# Patient Record
Sex: Male | Born: 1960 | Race: White | Hispanic: No | Marital: Married | Smoking: Current every day smoker
Health system: Southern US, Community
[De-identification: ages and names within clinical notes are randomized; demographics above are authoritative.]

## PROBLEM LIST (undated history)

## (undated) DIAGNOSIS — M1990 Unspecified osteoarthritis, unspecified site: Secondary | ICD-10-CM

## (undated) DIAGNOSIS — G629 Polyneuropathy, unspecified: Secondary | ICD-10-CM

## (undated) DIAGNOSIS — I10 Essential (primary) hypertension: Secondary | ICD-10-CM

## (undated) DIAGNOSIS — J449 Chronic obstructive pulmonary disease, unspecified: Secondary | ICD-10-CM

## (undated) HISTORY — PX: FRACTURE SURGERY: SHX138

---

## 2006-02-02 ENCOUNTER — Emergency Department: Payer: Self-pay | Admitting: Emergency Medicine

## 2012-01-01 ENCOUNTER — Ambulatory Visit: Payer: Self-pay | Admitting: Family Medicine

## 2016-07-12 ENCOUNTER — Encounter: Payer: Self-pay | Admitting: Emergency Medicine

## 2016-07-12 ENCOUNTER — Emergency Department
Admission: EM | Admit: 2016-07-12 | Discharge: 2016-07-12 | Disposition: A | Discharge status: Home / Self Care | Payer: Self-pay | Attending: Emergency Medicine | Admitting: Emergency Medicine

## 2016-07-12 DIAGNOSIS — F17210 Nicotine dependence, cigarettes, uncomplicated: Secondary | ICD-10-CM | POA: Insufficient documentation

## 2016-07-12 DIAGNOSIS — I10 Essential (primary) hypertension: Secondary | ICD-10-CM | POA: Insufficient documentation

## 2016-07-12 DIAGNOSIS — J449 Chronic obstructive pulmonary disease, unspecified: Secondary | ICD-10-CM | POA: Insufficient documentation

## 2016-07-12 DIAGNOSIS — F1123 Opioid dependence with withdrawal: Secondary | ICD-10-CM | POA: Insufficient documentation

## 2016-07-12 DIAGNOSIS — F1193 Opioid use, unspecified with withdrawal: Secondary | ICD-10-CM

## 2016-07-12 HISTORY — DX: Essential (primary) hypertension: I10

## 2016-07-12 HISTORY — DX: Unspecified osteoarthritis, unspecified site: M19.90

## 2016-07-12 HISTORY — DX: Chronic obstructive pulmonary disease, unspecified: J44.9

## 2016-07-12 HISTORY — DX: Polyneuropathy, unspecified: G62.9

## 2016-07-12 MED ORDER — BACLOFEN 10 MG PO TABS: 10.0000 mg | ORAL_TABLET | Freq: Three times a day (TID) | ORAL | 0 refills | Status: AC | PRN

## 2016-07-12 MED ORDER — KETOROLAC TROMETHAMINE 60 MG/2ML IM SOLN
INTRAMUSCULAR | Status: AC
  Administered 2016-07-12: 60 mg via INTRAMUSCULAR
  Filled 2016-07-12: qty 2

## 2016-07-12 MED ORDER — ONDANSETRON 4 MG PO TBDP
ORAL_TABLET | ORAL | Status: AC
  Administered 2016-07-12: 4 mg via ORAL
  Filled 2016-07-12: qty 1

## 2016-07-12 MED ORDER — BACLOFEN 5 MG HALF TABLET
5.0000 mg | ORAL_TABLET | Freq: Once | ORAL | Status: AC
  Administered 2016-07-12: 5 mg via ORAL
  Filled 2016-07-12: qty 1

## 2016-07-12 MED ORDER — ONDANSETRON HCL 4 MG PO TABS: 4.0000 mg | ORAL_TABLET | Freq: Three times a day (TID) | ORAL | 0 refills | Status: AC | PRN

## 2016-07-12 MED ORDER — KETOROLAC TROMETHAMINE 60 MG/2ML IM SOLN
60.0000 mg | Freq: Once | INTRAMUSCULAR | Status: AC
  Administered 2016-07-12: 60 mg via INTRAMUSCULAR

## 2016-07-12 MED ORDER — ONDANSETRON 4 MG PO TBDP
4.0000 mg | ORAL_TABLET | Freq: Once | ORAL | Status: AC
  Administered 2016-07-12: 4 mg via ORAL
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
## 2016-07-12 NOTE — Discharge Instructions (Signed)
Please seek medical attention for any high fevers, chest pain, shortness of breath, change in behavior, persistent vomiting, bloody stool or any other new or concerning symptoms.  
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
## 2016-08-31 ENCOUNTER — Emergency Department
Admission: EM | Admit: 2016-08-31 | Discharge: 2016-09-01 | Disposition: A | Discharge status: Home / Self Care | Payer: Self-pay | Attending: Emergency Medicine | Admitting: Emergency Medicine

## 2016-08-31 ENCOUNTER — Emergency Department: Payer: Self-pay

## 2016-08-31 DIAGNOSIS — M25474 Effusion, right foot: Secondary | ICD-10-CM

## 2016-08-31 DIAGNOSIS — J449 Chronic obstructive pulmonary disease, unspecified: Secondary | ICD-10-CM | POA: Insufficient documentation

## 2016-08-31 DIAGNOSIS — F17210 Nicotine dependence, cigarettes, uncomplicated: Secondary | ICD-10-CM | POA: Insufficient documentation

## 2016-08-31 DIAGNOSIS — I10 Essential (primary) hypertension: Secondary | ICD-10-CM | POA: Insufficient documentation

## 2016-08-31 DIAGNOSIS — M722 Plantar fascial fibromatosis: Secondary | ICD-10-CM | POA: Insufficient documentation

## 2016-09-01 MED ORDER — PREDNISONE 20 MG PO TABS
60.0000 mg | ORAL_TABLET | Freq: Once | ORAL | Status: AC
  Administered 2016-09-01: 60 mg via ORAL
  Filled 2016-09-01: qty 3

## 2016-09-01 MED ORDER — PREDNISONE 20 MG PO TABS: 60.0000 mg | ORAL_TABLET | Freq: Every day | ORAL | 0 refills | Status: AC

## 2016-09-01 MED ORDER — METOPROLOL TARTRATE 50 MG PO TABS
ORAL_TABLET | ORAL | Status: AC
  Filled 2016-09-01: qty 2

## 2016-09-01 NOTE — Discharge Instructions (Signed)
Please take your prednisone 60 mg daily for the next 4 days. Please follow-up with podiatry and please use your crutches until you are able to follow-up.

## 2016-09-01 NOTE — ED Provider Notes (Signed)
[facility] Emergency Department Provider Note   ____________________________________________   First MD Initiated Contact with Patient 09/01/16 [phone]     (approximate)  I have reviewed the triage vital signs and the nursing notes.   HISTORY  Chief Complaint Foot Pain    HPI Jesus Jenkins is a 56 y.o. male who comes into the hospital today with some right foot swelling. The patient reports that it started on Sunday. He woke up at about 5:30 Sunday morning to use the restroom and it was fine but when he got up at 7 and he put his foot on the floor he felt pain. He reports that he got up this morning and it was fine because the swelling seemed to go down after he slept and had elevated but then as the day went on the swelling returned. The patient also states that he had continued pain whenever he tried to put his foot down. The patient states that this pain is from his heel to his big toe on the plantar surface of his foot. He reports that he's been on Suboxone and had previously been with pain management. The foot does not hurt at baseline but he reports that whenever he tries to place weight on his foot hurts. The patient reports that he looked on the Internet trying to find the cause and his concern was for possible clot versus gout. The patient denies a history of gout and he also denies a family history of gout. The patient also thought he might of been bitten by something.The patient is here today for evaluation of his foot swelling. The patient denies any trauma or anything else that could've caused the swelling and pain.   Past Medical History:  Diagnosis Date  . Arthritis   . COPD (chronic obstructive pulmonary disease) (HCC)   . Hypertension   . Neuropathy (HCC)     There are no active problems to display for this patient.   Past Surgical History:  Procedure Laterality Date  . FRACTURE SURGERY      Prior to Admission medications     Medication Sig Start Date End Date Taking? Authorizing Provider  baclofen (LIORESAL) 10 MG tablet Take 1 tablet (10 mg total) by mouth 3 (three) times daily as needed (Pain). 07/12/16   Phineas SemenGoodman, Graydon, MD  ondansetron (ZOFRAN) 4 MG tablet Take 1 tablet (4 mg total) by mouth every 8 (eight) hours as needed for nausea or vomiting. 07/12/16   Phineas SemenGoodman, Graydon, MD    Allergies Neurontin [gabapentin]; Tramadol; and Flexeril [cyclobenzaprine]  No family history on file.  Social History Social History  Substance Use Topics  . Smoking status: Current Every Day Smoker    Packs/day: 1.00    Types: Cigarettes  . Smokeless tobacco: Never Used  . Alcohol use No    Review of Systems  Constitutional: No fever/chills Eyes: No visual changes. ENT: No sore throat. Cardiovascular: Denies chest pain. Respiratory: Denies shortness of breath. Gastrointestinal: No abdominal pain.  No nausea, no vomiting.  No diarrhea.  No constipation. Genitourinary: Negative for dysuria. Musculoskeletal: Right foot swelling Skin: Negative for rash. Neurological: Negative for headaches, focal weakness or numbness.   ____________________________________________   PHYSICAL EXAM:  VITAL SIGNS: ED Triage Vitals  Enc Vitals Group     BP 08/31/16 2059 (!) 158/90     Pulse Rate 08/31/16 2059 78     Resp 08/31/16 2059 18     Temp 08/31/16 2059 98.1 F (36.7 C)  Temp Source 08/31/16 2059 Oral     SpO2 08/31/16 2059 99 %     Weight 08/31/16 2059 159 lb (72.1 kg)     Height 08/31/16 2059 5\' 10"  (1.778 m)     Head Circumference --      Peak Flow --      Pain Score 08/31/16 2058 0     Pain Loc --      Pain Edu? --      Excl. in GC? --     Constitutional: Alert and oriented. Well appearing and in Mild distress. Eyes: Conjunctivae are normal. PERRL. EOMI. Head: Atraumatic. Nose: No congestion/rhinnorhea. Mouth/Throat: Mucous membranes are moist.  Oropharynx non-erythematous. Cardiovascular: Normal  rate, regular rhythm. Grossly normal heart sounds.  Good peripheral circulation. Respiratory: Normal respiratory effort.  No retractions. Lungs CTAB. Gastrointestinal: Soft and nontender. No distention. Positive bowel sounds Musculoskeletal: Soft tissue swelling to the right foot with some tenderness to palpation over the plantar fascia from the heel to the big toe. Neurologic:  Normal speech and language.  Skin:  Skin is warm, dry and intact.  Psychiatric: Mood and affect are normal.   ____________________________________________   LABS (all labs ordered are listed, but only abnormal results are displayed)  Labs Reviewed - No data to display ____________________________________________  EKG  none ____________________________________________  RADIOLOGY  US venous RLE ____________________________________________   PROCEDURES  Procedure(s) performed: None  Procedures  Critical Care performed: No  ____________________________________________   INITIAL IMPRESSION / ASSESSMENT AND PLAN / ED COURSE  Pertinent labs & imaging results that were available during my care of the patient were reviewed by me and considered in my medical decision making (see chart for details).  This is a 56 year old male who comes into the hospital today with some right foot swelling and some pain over the plantar fascia. The patient's foot is not red or warm to touch. He has pain along the plantar fascia and not in the joint of the big toe or the ankle. I do not feel that this is gout causing his symptoms. The patient did receive an ultrasound of his leg looking for DVT and it was negative. I feel that the patient has some plantar fasciitis. The patient reports that he does have crutches at home as I suggested the patient be nonweightbearing and try some steroids and anti-inflammatories to help with the pain. The patient should follow-up with podiatry for further evaluation of this foot pain and  swelling. I will recommend the patient follow up with podiatry. The patient will receive a dose of prednisone here in the emergency department.  Clinical Course as of Sep 02 111  Tue Sep 01, 2016  0026 No evidence of DVT within the right lower extremity. US Venous Img Lower Unilateral Right [AW]    Clinical Course User Index [AW] Rebecka Apley, MD     ____________________________________________   FINAL CLINICAL IMPRESSION(S) / ED DIAGNOSES  Final diagnoses:  Plantar fasciitis      NEW MEDICATIONS STARTED DURING THIS VISIT:  New Prescriptions   No medications on file     Note:  This document was prepared using Dragon voice recognition software and may include unintentional dictation errors.    Rebecka Apley, MD 09/01/16 [phone]

## 2016-12-09 ENCOUNTER — Ambulatory Visit: Payer: Self-pay | Admitting: Internal Medicine

## 2016-12-09 ENCOUNTER — Encounter: Payer: Self-pay | Admitting: Internal Medicine

## 2016-12-09 VITALS — BP 136/79 | HR 77 | Temp 97.9°F | Wt 154.2 lb

## 2016-12-09 DIAGNOSIS — F1920 Other psychoactive substance dependence, uncomplicated: Secondary | ICD-10-CM

## 2016-12-09 DIAGNOSIS — Z122 Encounter for screening for malignant neoplasm of respiratory organs: Secondary | ICD-10-CM

## 2016-12-09 NOTE — Progress Notes (Signed)
   Subjective:    Patient ID: Jesus Jenkins, male    DOB: 1960/11/08, 56 y.o.   MRN: [id number]  HPI   Pt reports seeing pain management specialist but reports that he was unhappy with his services; he reports stopping all opioid use in the last 5 mo. after 8 years of use.    Pt reports seeking care elsewhere for behavioral health due to feelings of anxiety and depression for the last 5 years.   Pt reports using Suboxone.   Pt reports being diagnosed with emphysema due to smoking for 43 years.   Pt reports waking 2-3 times in the night for urination.   Pt reports both biological parents have diabetes; mother also has history of lung and stomach cancer.      There are no active problems to display for this patient.   Allergies as of 12/09/2016      Reactions   Neurontin [gabapentin] Other (See Comments)   Pt does not know   Tramadol Other (See Comments)   Flexeril [cyclobenzaprine] Rash      Medication List       Accurate as of 12/09/16 10:45 AM. Always use your most recent med list.          baclofen 10 MG tablet Commonly known as:  LIORESAL Take 1 tablet (10 mg total) by mouth 3 (three) times daily as needed (Pain).   ondansetron 4 MG tablet Commonly known as:  ZOFRAN Take 1 tablet (4 mg total) by mouth every 8 (eight) hours as needed for nausea or vomiting.   predniSONE 20 MG tablet Commonly known as:  DELTASONE Take 3 tablets (60 mg total) by mouth daily.        Review of Systems     Objective:   Physical Exam  Constitutional: He is oriented to person, place, and time.  Cardiovascular: Normal rate, regular rhythm and normal heart sounds.   Pulmonary/Chest: Effort normal and breath sounds normal.  Neurological: He is alert and oriented to person, place, and time.     BP 136/79 (BP Location: Left Arm)   Pulse 77   Temp 97.9 F (36.6 C)   Wt 154 lb 3.2 oz (69.9 kg)   BMI 22.13 kg/m          Assessment & Plan:   Labs today: CBC, Met C,  TSH, UA, Lipid, PSA  Order CAT scan for lungs to look for nodules due to prolonged smoking (1pack/day 43 years).

## 2016-12-10 LAB — URINALYSIS
Bilirubin, UA: NEGATIVE
Glucose, UA: NEGATIVE
Leukocytes, UA: NEGATIVE
Nitrite, UA: NEGATIVE
RBC, UA: NEGATIVE
Specific Gravity, UA: 1.026 (ref 1.005–1.030)
Urobilinogen, Ur: 1 mg/dL (ref 0.2–1.0)
pH, UA: 5 (ref 5.0–7.5)

## 2016-12-10 LAB — COMPREHENSIVE METABOLIC PANEL WITH GFR
ALT: 35 IU/L (ref 0–44)
AST: 31 IU/L (ref 0–40)
Albumin/Globulin Ratio: 1.5 (ref 1.2–2.2)
Albumin: 4.5 g/dL (ref 3.5–5.5)
Alkaline Phosphatase: 86 IU/L (ref 39–117)
BUN/Creatinine Ratio: 13 (ref 9–20)
BUN: 12 mg/dL (ref 6–24)
Bilirubin Total: 0.5 mg/dL (ref 0.0–1.2)
CO2: 24 mmol/L (ref 20–29)
Calcium: 9.4 mg/dL (ref 8.7–10.2)
Chloride: 103 mmol/L (ref 96–106)
Creatinine, Ser: 0.93 mg/dL (ref 0.76–1.27)
GFR calc Af Amer: 105 mL/min/1.73
GFR calc non Af Amer: 91 mL/min/1.73
Globulin, Total: 3.1 g/dL (ref 1.5–4.5)
Glucose: 92 mg/dL (ref 65–99)
Potassium: 4.7 mmol/L (ref 3.5–5.2)
Sodium: 142 mmol/L (ref 134–144)
Total Protein: 7.6 g/dL (ref 6.0–8.5)

## 2016-12-10 LAB — LIPID PANEL
Chol/HDL Ratio: 4.2 ratio (ref 0.0–5.0)
Cholesterol, Total: 173 mg/dL (ref 100–199)
HDL: 41 mg/dL
LDL Calculated: 118 mg/dL — ABNORMAL HIGH (ref 0–99)
Triglycerides: 68 mg/dL (ref 0–149)
VLDL Cholesterol Cal: 14 mg/dL (ref 5–40)

## 2016-12-10 LAB — CBC
Hematocrit: 46.3 % (ref 37.5–51.0)
Hemoglobin: 15.6 g/dL (ref 13.0–17.7)
MCH: 29.5 pg (ref 26.6–33.0)
MCHC: 33.7 g/dL (ref 31.5–35.7)
MCV: 88 fL (ref 79–97)
Platelets: 228 x10E3/uL (ref 150–379)
RBC: 5.28 x10E6/uL (ref 4.14–5.80)
RDW: 13.6 % (ref 12.3–15.4)
WBC: 7 x10E3/uL (ref 3.4–10.8)

## 2016-12-10 LAB — PSA: Prostate Specific Ag, Serum: 1.5 ng/mL (ref 0.0–4.0)

## 2016-12-10 LAB — TSH: TSH: 1.6 u[IU]/mL (ref 0.450–4.500)

## 2016-12-10 LAB — HEMOGLOBIN A1C
Est. average glucose Bld gHb Est-mCnc: 103 mg/dL
Hgb A1c MFr Bld: 5.2 % (ref 4.8–5.6)

## 2017-12-21 IMAGING — US US EXTREM LOW VENOUS*R*
1 series · 13 of 24 positions shown · non-contrast
Comparison: None.

CLINICAL DATA: 55 y/o  M; swelling of the foot.



[Series 1: us extrem low venous*right* · 0.07mm/px · 13 of 44 slices shown]
[im 1/44]
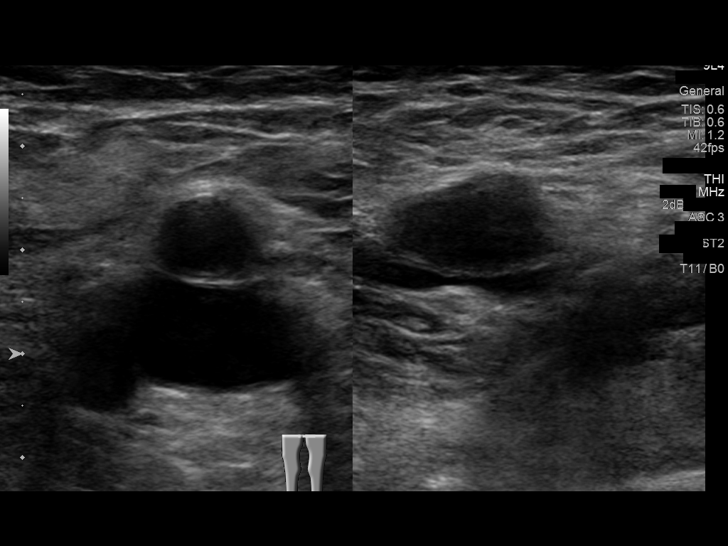
[im 4/44]
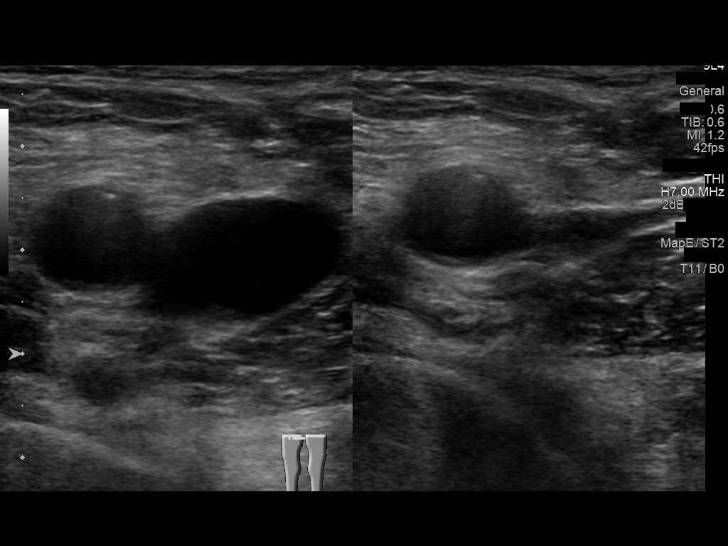
[im 8/44]
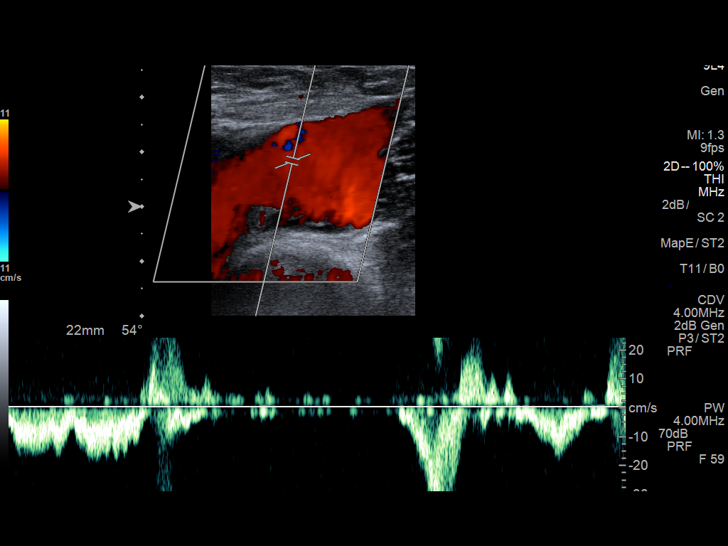
[im 12/44]
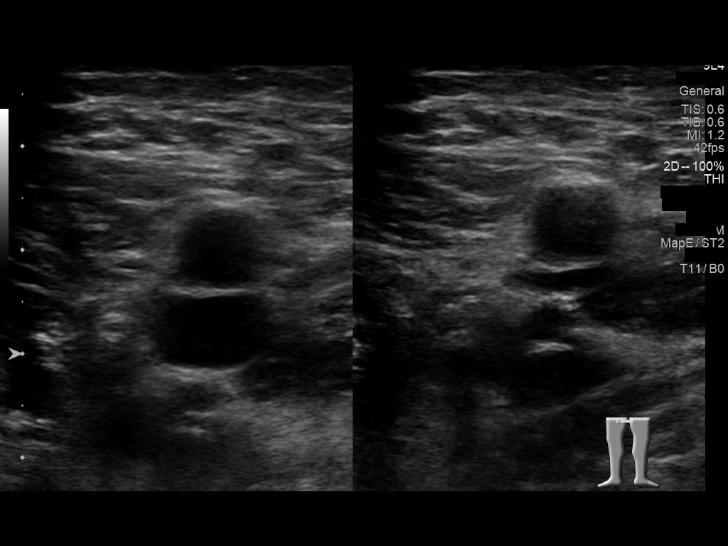
[im 15/44]
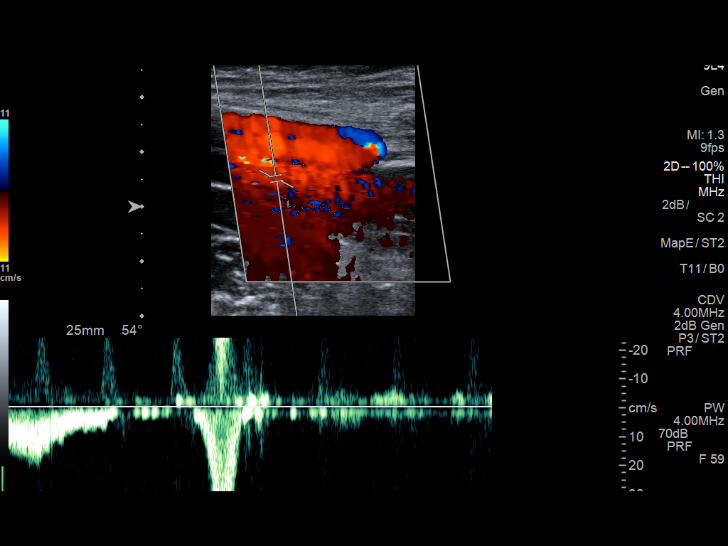
[im 19/44]
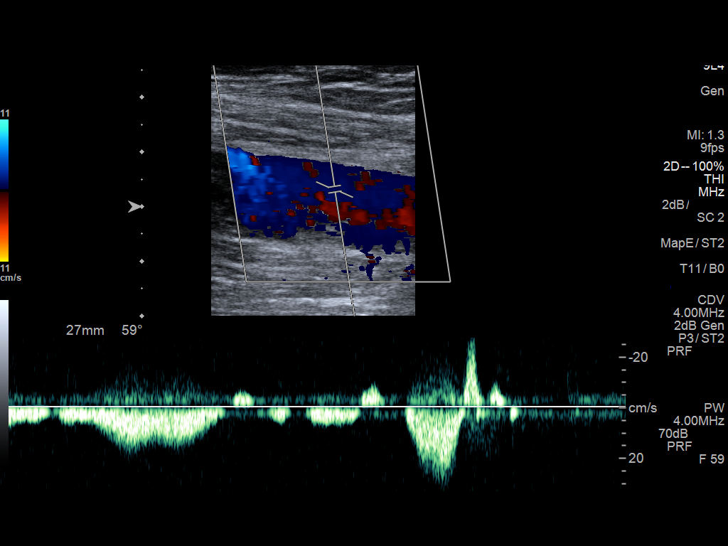
[im 23/44]
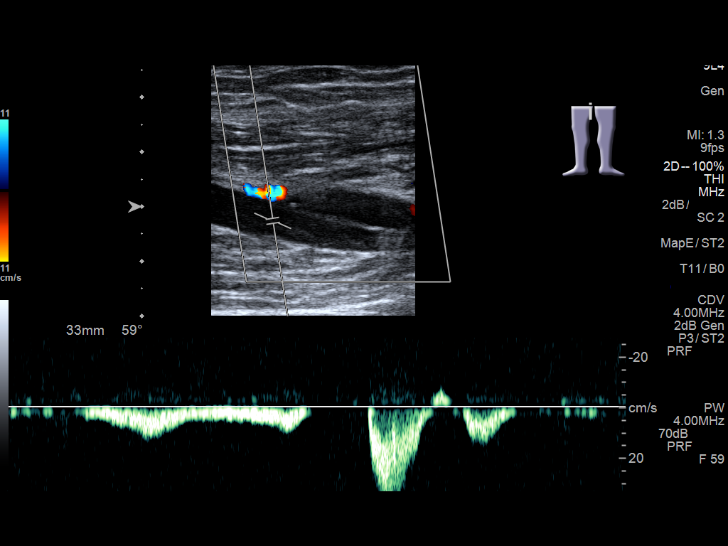
[im 25/44]
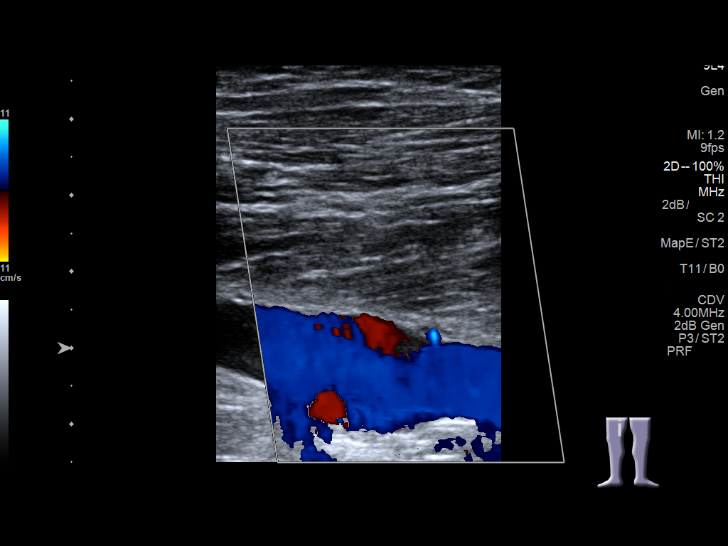
[im 29/44]
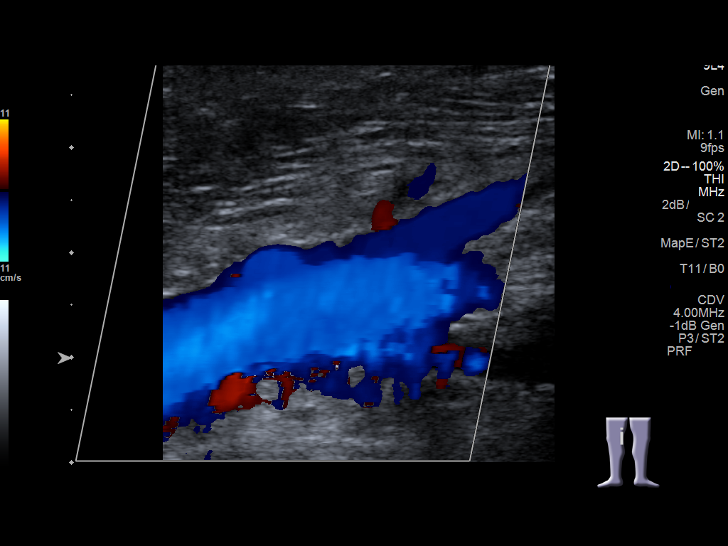
[im 32/44]
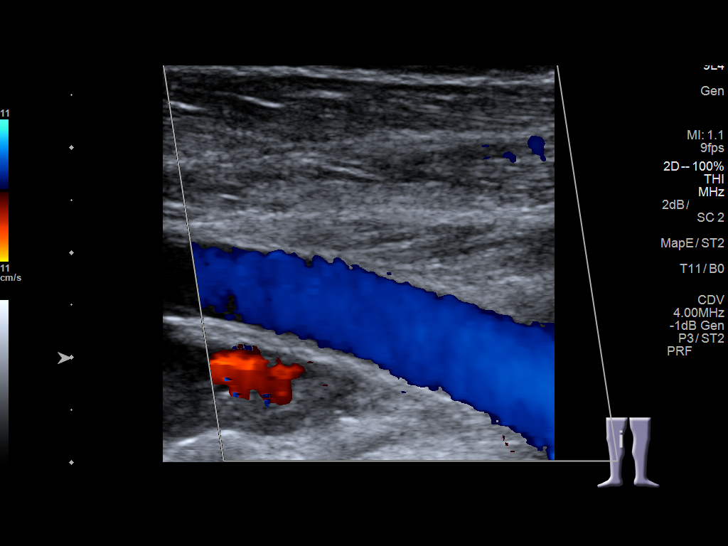
[im 36/44]
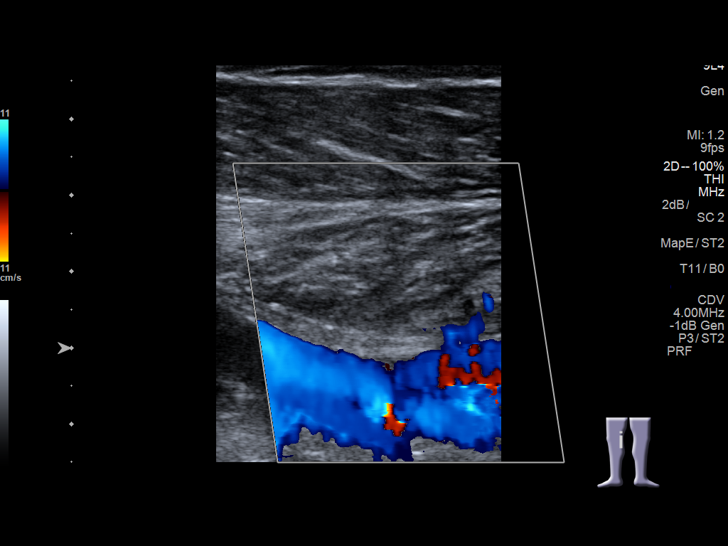
[im 40/44]
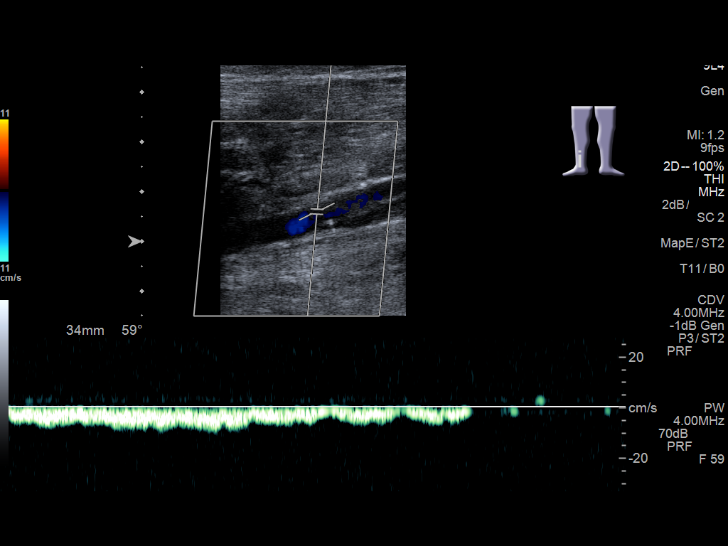
[im 44/44]
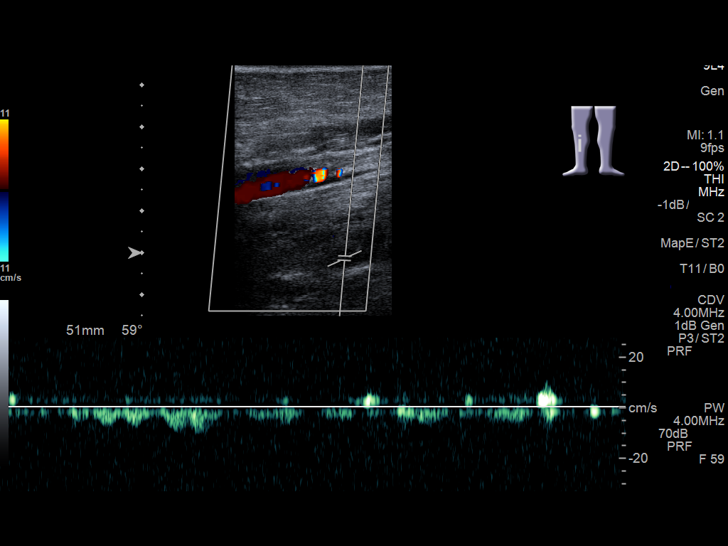

[13 of 24 positions shown; findings below may reference images not displayed]

FINDINGS: Contralateral Common Femoral Vein: Respiratory phasicity is normal
and symmetric with the symptomatic side. No evidence of thrombus.
Normal compressibility.

Common Femoral Vein: No evidence of thrombus. Normal
compressibility, respiratory phasicity and response to augmentation.

Saphenofemoral Junction: No evidence of thrombus. Normal
compressibility and flow on color Doppler imaging.

Profunda Femoral Vein: No evidence of thrombus. Normal
compressibility and flow on color Doppler imaging.

Femoral Vein: No evidence of thrombus. Normal compressibility,
respiratory phasicity and response to augmentation.

Popliteal Vein: No evidence of thrombus. Normal compressibility,
respiratory phasicity and response to augmentation.

Calf Veins: No evidence of thrombus. Normal compressibility and flow
on color Doppler imaging.

Superficial Great Saphenous Vein: No evidence of thrombus. Normal
compressibility and flow on color Doppler imaging.

Venous Reflux:  None.

Other Findings:  None.
IMPRESSION: No evidence of DVT within the right lower extremity.

By: Yumi Bosse M.D.
